# Patient Record
Sex: Female | Born: 1970
Health system: Southern US, Community
[De-identification: ages and names within clinical notes are randomized; demographics above are authoritative.]

---

## 1998-04-21 ENCOUNTER — Other Ambulatory Visit: Admission: RE | Admit: 1998-04-21 | Discharge: 1998-04-21 | Payer: Self-pay | Admitting: Obstetrics and Gynecology

## 1999-04-23 ENCOUNTER — Other Ambulatory Visit: Admission: RE | Admit: 1999-04-23 | Discharge: 1999-04-23 | Payer: Self-pay | Admitting: Obstetrics and Gynecology

## 2000-06-20 ENCOUNTER — Other Ambulatory Visit: Admission: RE | Admit: 2000-06-20 | Discharge: 2000-06-20 | Payer: Self-pay | Admitting: Obstetrics and Gynecology

## 2001-06-29 ENCOUNTER — Other Ambulatory Visit: Admission: RE | Admit: 2001-06-29 | Discharge: 2001-06-29 | Payer: Self-pay | Admitting: Obstetrics and Gynecology

## 2002-03-27 ENCOUNTER — Encounter: Admission: RE | Admit: 2002-03-27 | Discharge: 2002-06-25 | Payer: Self-pay | Admitting: Family Medicine

## 2002-07-03 ENCOUNTER — Other Ambulatory Visit: Admission: RE | Admit: 2002-07-03 | Discharge: 2002-07-03 | Payer: Self-pay | Admitting: Obstetrics and Gynecology

## 2003-07-24 ENCOUNTER — Other Ambulatory Visit: Admission: RE | Admit: 2003-07-24 | Discharge: 2003-07-24 | Payer: Self-pay | Admitting: Obstetrics and Gynecology

## 2004-04-07 ENCOUNTER — Other Ambulatory Visit: Admission: RE | Admit: 2004-04-07 | Discharge: 2004-04-07 | Payer: Self-pay | Admitting: Obstetrics and Gynecology

## 2004-11-05 ENCOUNTER — Inpatient Hospital Stay (HOSPITAL_COMMUNITY): Admission: AD | Admit: 2004-11-05 | Discharge: 2004-11-08 | Payer: Self-pay | Admitting: Obstetrics and Gynecology

## 2004-11-05 ENCOUNTER — Encounter (INDEPENDENT_AMBULATORY_CARE_PROVIDER_SITE_OTHER): Payer: Self-pay | Admitting: *Deleted

## 2004-11-10 ENCOUNTER — Ambulatory Visit: Admission: RE | Admit: 2004-11-10 | Discharge: 2004-11-10 | Payer: Self-pay | Admitting: Obstetrics and Gynecology

## 2004-12-03 ENCOUNTER — Other Ambulatory Visit: Admission: RE | Admit: 2004-12-03 | Discharge: 2004-12-03 | Payer: Self-pay | Admitting: Obstetrics and Gynecology

## 2009-05-20 ENCOUNTER — Encounter: Admission: RE | Admit: 2009-05-20 | Discharge: 2009-05-20 | Payer: Self-pay | Admitting: Obstetrics and Gynecology

## 2010-08-06 NOTE — Discharge Summary (Signed)
NAMESYANNA, REMMERT            ACCOUNT NO.:  [id number]   MEDICAL RECORD NO.:  [id number]          PATIENT TYPE:  INP   LOCATION:  9142                          FACILITY:  [facility]   PHYSICIAN:  Juluis Mire, M.D.   DATE OF BIRTH:  [birth year]   DATE OF ADMISSION:  11/05/2004  DATE OF DISCHARGE:  11/08/2004                                 DISCHARGE SUMMARY   ADMISSION DIAGNOSES:  1.  Intrauterine pregnancy at 40-3/7 weeks' estimated gestational age.  2.  Spontaneous onset of labor.   DISCHARGE DIAGNOSES:  1.  Status post low transverse cesarean section secondary to arrest of      descent and nonreassuring fetal heart tones.  2.  Viable female infant.   PROCEDURE:  Primary low transverse cesarean section.   REASON FOR ADMISSION:  Please see written H&P.   HOSPITAL COURSE:  The patient was a 40 year old white married female  primigravida who was admitted to [facility] in labor.  On  admission the cervix was noted to be 8 cm dilated, completely effaced,  vertex at a  -1 station with a bulging bag of water.  Artificial rupture of  membranes was performed, which revealed clear fluid.  The patient was known  to be positive for group B beta strep, and antibiotics were given  prophylactically.  The patient did complete full dilatation and began  pushing.  After pushing for approximately 2-1/2 hours, vertex was noted to  be molding at a +1 station.  Estimated fetal weight was 8-1/2 to 9 pounds.  A decision was made to proceed with a low transverse cesarean section.  The  patient was then transferred to the operating room, where epidural was  augmented to an adequate level.  Auscultation of fetal heart tones in the operating room was noted to be 90  beats per minute.  After testing for adequate epidural anesthesia, it was  found not to be adequate.  A decision was made to convert to the epidural to  general anesthesia, which was performed by Dr. Arby Barrette.  A low  transverse  incision was made with delivery of a viable female infant weighing 8 pounds 7  ounces  with Apgars of 8 at one minute and 9 at five minutes.  Arterial cord  pH was 7.28.  The patient tolerated the procedure well and was taken to the  recovery room in stable condition.  On postoperative day #1 vital signs were  stable, the abdomen soft.  Fundus was firm and nontender and abdominal  dressing was noted to be clean, dry and intact.  Laboratory findings  revealed hemoglobin of 10.2, platelet count of 206,000, WBC count of 11.4.  On postoperative day #2 the patient was without complaint.  Vital signs were  stable.  Fundus firm and nontender.  Abdominal dressing had been removed,  revealing an incision that was clean, dry and intact.  The patient was  ambulating well.  On postoperative day #3 the patient was without complaint.  Vital signs were stable.  Fundus was firm and nontender.  Incision was  clean, dry and intact.  Staples  were removed.  The patient was discharged  home.   CONDITION ON DISCHARGE:  Good.   DIET:  Regular as tolerated.   ACTIVITY:  No heavy lifting, no driving x2 weeks, no vaginal entry.   FOLLOW-UP:  Patient to follow up in the office in one to two weeks for  incision check.  She is to call for temperature greater than 100 degrees and  persistent nausea and vomiting, heavy vaginal bleeding and/or redness or  drainage of the incisional site.   DISCHARGE MEDICATIONS:  1.  Tylox #30, one p.o. every four to six hours p.r.n.  2.  Motrin 600 mg every six hours.  3.  Prenatal vitamins one p.o. daily.  4.  Colace n.p.o. daily.      Julio Sicks, N.P.      Juluis Mire, M.D.  Electronically Signed    CC/MEDQ  D:  12/13/2004  T:  12/14/2004  Job:  [id number]

## 2010-08-06 NOTE — Op Note (Signed)
NAMESHADE, RIVENBARK            ACCOUNT NO.:  [id number]   MEDICAL RECORD NO.:  [id number]          PATIENT TYPE:  INP   LOCATION:  9167                          FACILITY:  [facility]   PHYSICIAN:  Guy Sandifer. Henderson Cloud, M.D. DATE OF BIRTH:  1971/02/13   DATE OF PROCEDURE:  11/05/2004  DATE OF DISCHARGE:                                 OPERATIVE REPORT   PREOPERATIVE DIAGNOSES:  1.  Intrauterine pregnancy at 40-3/7 weeks.  2.  Arrest of descent.  3.  Nonreassuring fetal heart tracing.   POSTOPERATIVE DIAGNOSES:  1.  Intrauterine pregnancy at 40-3/7 weeks.  2.  Arrest of descent.  3.  Nonreassuring fetal heart tracing.   PROCEDURE:  Low transverse cesarean section.   SURGEON:  Guy Sandifer. Henderson Cloud, M.D.   ANESTHESIA:  Epidural converted to general anesthesia with endotracheal  intubation, Quillian Quince, M.D.   ESTIMATED BLOOD LOSS:  1000 mL.   SPECIMENS:  Placenta.   FINDINGS:  Viable female infant, Apgar's of 8 and 9 at 1 and 5 minutes  respectively. Birth weight 8 pounds 7 ounces, arterial cord pH 7.28.   INDICATIONS AND CONSENT:  This patient is a 40 year old married white  female, G1, P0 whose pregnancy has been complicated by a history of HSV  currently on Valtrex without prodrome or visible lesions on exam. She also  has a history of anxiety and depression and positive group B beta strep  culture. She presents to the hospital with complaints of uterine  contractions. At 8:25 a.m., the cervix is 8 cm dilated, completely effaced,  -1 station, vertex with a bulging bag of water. Artifical rupture of  membranes if performed for clear fluid. The patient is on group B beta strep  antibiotic prophylaxis. She proceeds to complete in pushing. She pushes for  approximately 2 1/2 hours and the vertex is at the plus 1 with molding. She  gets descent with pushing but the vertex retracts back to +1. Estimated  fetal weight on ultrasound was approximately 8.5 to 9 pounds. After  discussing  the options, cesarean section is reviewed. The potential risks  and complications are discussed including not limited to infection, bowel,  bladder, ureteral damage, bleeding requiring transfusion of blood products  with possible transfusion reaction, HIV and hepatitis acquisition. DVT, PE  and pneumonia. All questions are answered and consent is signed on the  chart. Fetal heart tones are reactive in the normal range in the labor and  delivery room.   DESCRIPTION OF PROCEDURE:  The patient was transferred to the operating room  where she was identified and her epidural is augmented. She is placed in the  dorsal supine position with 15 degree left lateral wedge and already has a  Foley catheter in place. Auscultation of fetal heart tones in the operating  room is in the 90's. The patient is prepped and draped in a sterile fashion.  Testing for adequate epidural anesthesia is carried out and it is found to  be inadequate. It is not felt we have time to wait for redosing of the  epidural due to the fetal heart rate. Therefore  general anesthesia is  undertaken with Dr. Arby Barrette. After establishing a secure airway, the skin  is incised through a low transverse Pfannenstiel incision and dissection is  carried out in layers to the peritoneum. The peritoneum is incised and  bluntly extended. The vesicouterine peritoneum is taken down  cephalolaterally, dissected and the bladder flap is placed. The uterus is  incised in a low transverse manner and the uterine cavity is entered bluntly  with the heel of the scalpel handle. The uterine incision is then extended  cephalolaterally with the fingers. The baby is found to be in the [facility]  presentation. Vertex is delivered without difficulty. Nuchal cord x1 is  reduced. Oral and nasopharynx are suctioned. The remainder of the baby is  delivered and good cry and tone is noted. The placenta is manually delivered  and sent to pathology. The uterine cavity is  cleaned. The uterus is closed  in two running locking imbricating layers of #0 Monocryl suture which  achieves good hemostasis. Tubes and ovaries are normal bilaterally. The  anterior peritoneum is closed in running fashion with #0 Monocryl suture  which is also used to reapproximate the pyramidalis muscle in the midline.  The anterior rectus fascia is closed in a running fashion with #0 PDS. The  skin is closed with clips. All sponge, instrument and needle counts are  correct and the patient is awakened and taken to the recovery room in stable  condition.      Guy Sandifer Henderson Cloud, M.D.  Electronically Signed     JET/MEDQ  D:  11/05/2004  T:  11/05/2004  Job:  [id number]

## 2011-03-09 ENCOUNTER — Ambulatory Visit
Admission: RE | Admit: 2011-03-09 | Discharge: 2011-03-09 | Disposition: A | Discharge status: Home / Self Care | Payer: 59 | Source: Ambulatory Visit | Attending: Family Medicine | Admitting: Family Medicine

## 2011-03-09 ENCOUNTER — Other Ambulatory Visit: Payer: Self-pay | Admitting: Family Medicine

## 2011-03-09 MED ORDER — IOHEXOL 300 MG/ML  SOLN
100.0000 mL | Freq: Once | INTRAMUSCULAR | Status: AC | PRN
  Administered 2011-03-09: 100 mL via INTRAVENOUS

## 2011-06-23 ENCOUNTER — Other Ambulatory Visit: Payer: Self-pay

## 2013-10-14 ENCOUNTER — Encounter: Payer: Self-pay | Admitting: Gastroenterology

## 2014-07-04 ENCOUNTER — Other Ambulatory Visit: Payer: Self-pay | Admitting: Family Medicine

## 2014-07-04 ENCOUNTER — Ambulatory Visit
Admission: RE | Admit: 2014-07-04 | Discharge: 2014-07-04 | Disposition: A | Discharge status: Home / Self Care | Payer: 59 | Source: Ambulatory Visit | Attending: Family Medicine | Admitting: Family Medicine

## 2014-07-04 DIAGNOSIS — M436 Torticollis: Secondary | ICD-10-CM

## 2014-07-04 DIAGNOSIS — M542 Cervicalgia: Secondary | ICD-10-CM

## 2014-08-11 ENCOUNTER — Other Ambulatory Visit: Payer: Self-pay | Admitting: Physician Assistant

## 2014-08-11 DIAGNOSIS — R1011 Right upper quadrant pain: Secondary | ICD-10-CM

## 2014-08-15 ENCOUNTER — Ambulatory Visit
Admission: RE | Admit: 2014-08-15 | Discharge: 2014-08-15 | Disposition: A | Discharge status: Home / Self Care | Payer: BLUE CROSS/BLUE SHIELD | Source: Ambulatory Visit | Attending: Physician Assistant | Admitting: Physician Assistant

## 2014-08-15 DIAGNOSIS — R1011 Right upper quadrant pain: Secondary | ICD-10-CM

## 2014-10-31 ENCOUNTER — Other Ambulatory Visit: Payer: Self-pay | Admitting: Obstetrics and Gynecology

## 2014-11-03 LAB — CYTOLOGY - PAP

## 2015-11-05 ENCOUNTER — Other Ambulatory Visit: Payer: Self-pay | Admitting: Obstetrics and Gynecology

## 2015-11-05 DIAGNOSIS — R928 Other abnormal and inconclusive findings on diagnostic imaging of breast: Secondary | ICD-10-CM

## 2015-11-10 ENCOUNTER — Ambulatory Visit
Admission: RE | Admit: 2015-11-10 | Discharge: 2015-11-10 | Disposition: A | Discharge status: Home / Self Care | Payer: Managed Care, Other (non HMO) | Source: Ambulatory Visit | Attending: Obstetrics and Gynecology | Admitting: Obstetrics and Gynecology

## 2015-11-10 DIAGNOSIS — R928 Other abnormal and inconclusive findings on diagnostic imaging of breast: Secondary | ICD-10-CM

## 2017-01-27 DIAGNOSIS — J069 Acute upper respiratory infection, unspecified: Secondary | ICD-10-CM | POA: Diagnosis not present

## 2017-01-27 DIAGNOSIS — B9789 Other viral agents as the cause of diseases classified elsewhere: Secondary | ICD-10-CM | POA: Diagnosis not present

## 2017-02-15 IMAGING — CR DG CERVICAL SPINE 2 OR 3 VIEWS
3 series · 3 of 3 positions shown · non-contrast
Comparison: None.

CLINICAL DATA: Chronic cervicalgia and stiffness. Pain radiates
into both shoulders

EXAM:
CERVICAL SPINE - 2-3 VIEW

[w cervical spine lat]
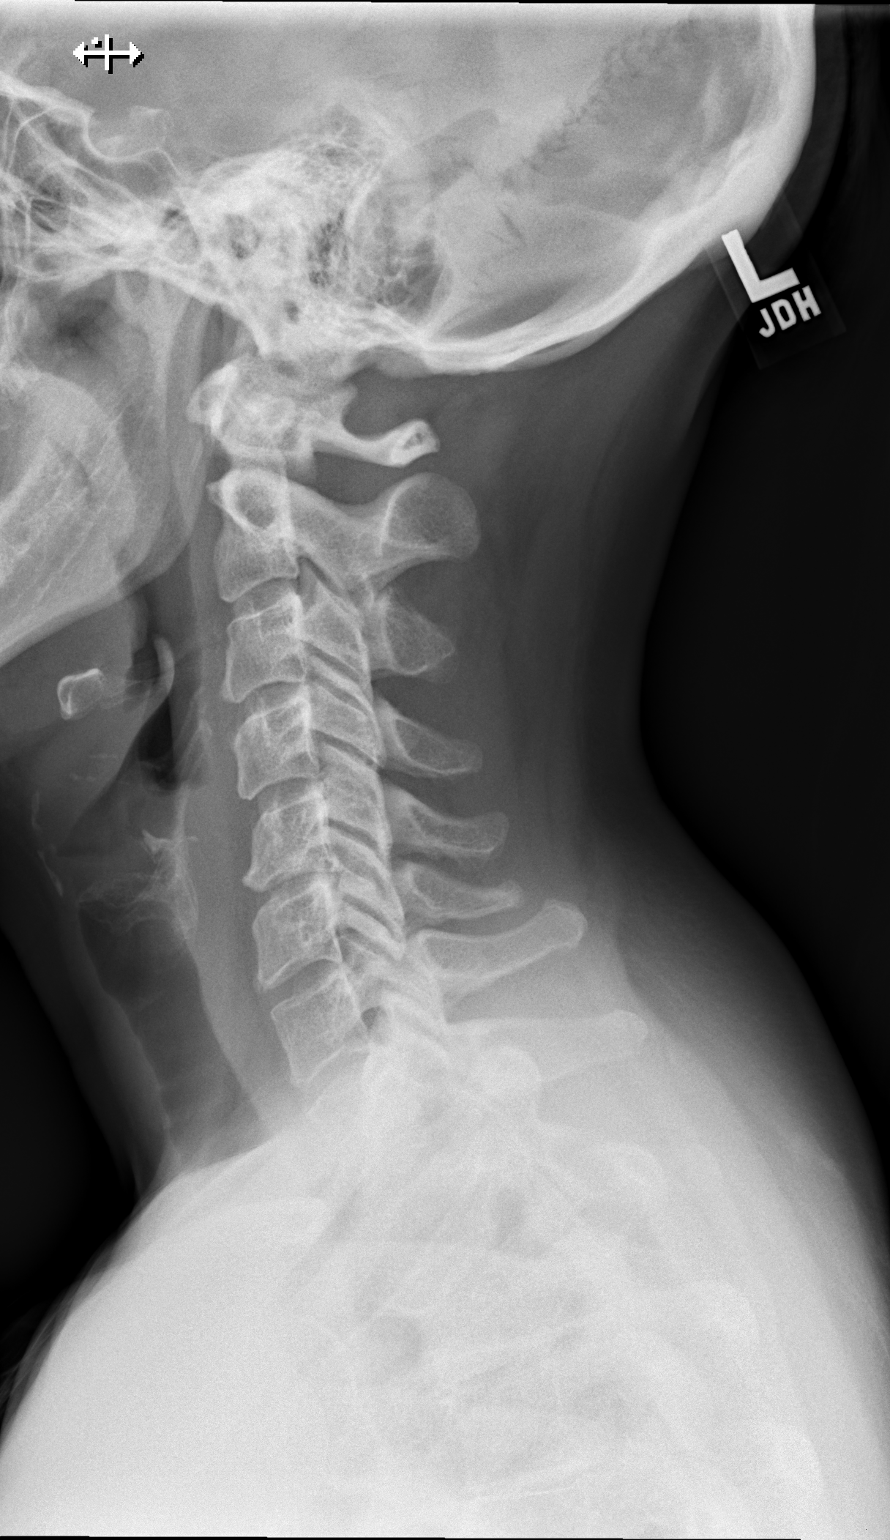

[w cervical spine ap]
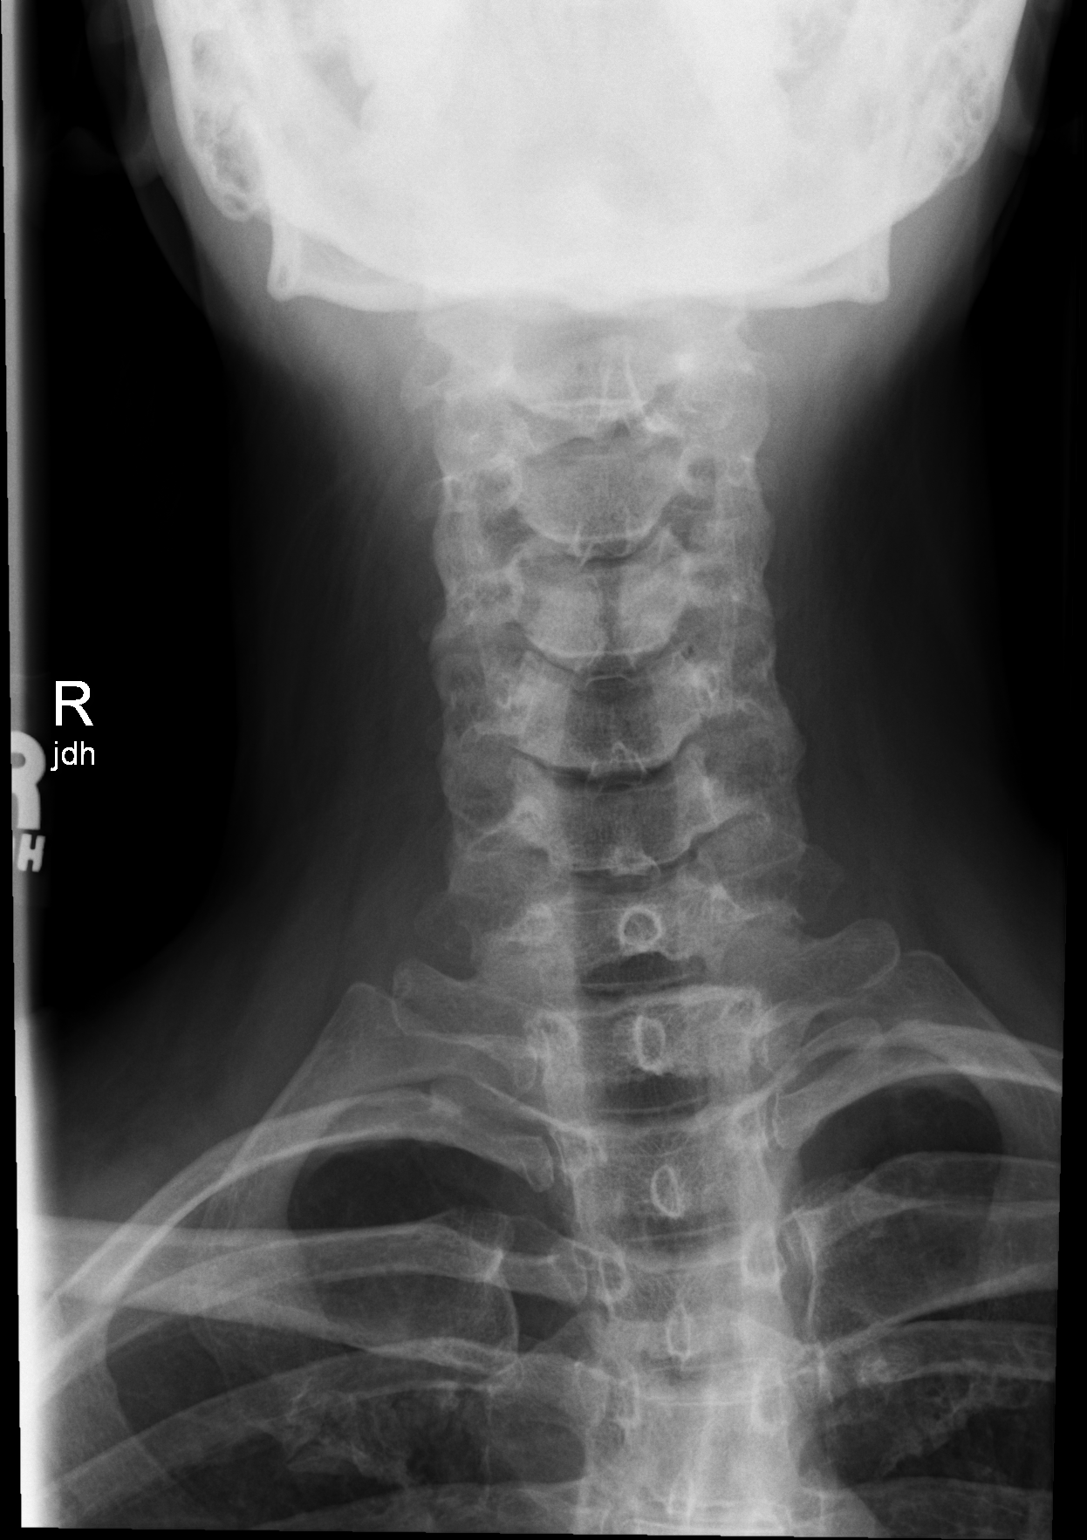

[w cervical spine odontoid]
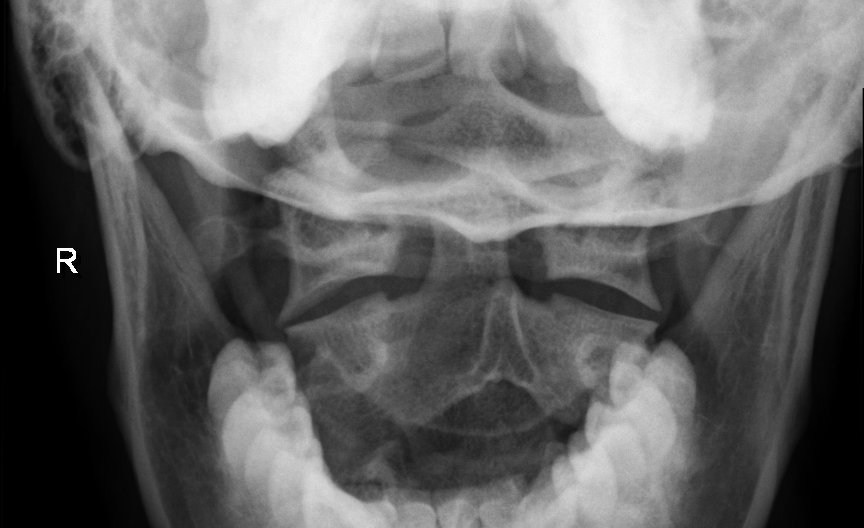

[3 of 3 positions shown; findings below may reference images not displayed]

FINDINGS: Frontal, lateral, and open-mouth odontoid images were obtained.
There is no fracture or spondylolisthesis. Prevertebral soft tissues
and predental space regions are normal. There is moderate disc space
narrowing at C5-6. Other disc spaces appear normal. There is no
erosive change. There is upper thoracic levoscoliosis.
IMPRESSION: Focal disc space narrowing at C5-6. No fracture or
spondylolisthesis. Upper thoracic levoscoliosis.

## 2017-03-07 DIAGNOSIS — F329 Major depressive disorder, single episode, unspecified: Secondary | ICD-10-CM | POA: Diagnosis not present

## 2017-03-07 DIAGNOSIS — F411 Generalized anxiety disorder: Secondary | ICD-10-CM | POA: Diagnosis not present

## 2017-09-10 DIAGNOSIS — F411 Generalized anxiety disorder: Secondary | ICD-10-CM | POA: Diagnosis not present

## 2017-09-12 DIAGNOSIS — K219 Gastro-esophageal reflux disease without esophagitis: Secondary | ICD-10-CM | POA: Diagnosis not present

## 2017-09-12 DIAGNOSIS — F411 Generalized anxiety disorder: Secondary | ICD-10-CM | POA: Diagnosis not present

## 2017-09-12 DIAGNOSIS — F329 Major depressive disorder, single episode, unspecified: Secondary | ICD-10-CM | POA: Diagnosis not present

## 2017-11-01 DIAGNOSIS — F411 Generalized anxiety disorder: Secondary | ICD-10-CM | POA: Diagnosis not present

## 2017-12-07 DIAGNOSIS — Z6831 Body mass index (BMI) 31.0-31.9, adult: Secondary | ICD-10-CM | POA: Diagnosis not present

## 2017-12-07 DIAGNOSIS — Z01419 Encounter for gynecological examination (general) (routine) without abnormal findings: Secondary | ICD-10-CM | POA: Diagnosis not present

## 2017-12-07 DIAGNOSIS — Z1231 Encounter for screening mammogram for malignant neoplasm of breast: Secondary | ICD-10-CM | POA: Diagnosis not present

## 2017-12-07 DIAGNOSIS — Z803 Family history of malignant neoplasm of breast: Secondary | ICD-10-CM | POA: Diagnosis not present

## 2017-12-11 ENCOUNTER — Other Ambulatory Visit: Payer: Self-pay | Admitting: Obstetrics and Gynecology

## 2017-12-11 DIAGNOSIS — R928 Other abnormal and inconclusive findings on diagnostic imaging of breast: Secondary | ICD-10-CM

## 2017-12-14 ENCOUNTER — Ambulatory Visit: Admission: RE | Admit: 2017-12-14 | Payer: Managed Care, Other (non HMO) | Source: Ambulatory Visit

## 2017-12-14 ENCOUNTER — Ambulatory Visit
Admission: RE | Admit: 2017-12-14 | Discharge: 2017-12-14 | Disposition: A | Discharge status: Home / Self Care | Payer: BLUE CROSS/BLUE SHIELD | Source: Ambulatory Visit | Attending: Obstetrics and Gynecology | Admitting: Obstetrics and Gynecology

## 2017-12-14 DIAGNOSIS — R922 Inconclusive mammogram: Secondary | ICD-10-CM | POA: Diagnosis not present

## 2017-12-14 DIAGNOSIS — R928 Other abnormal and inconclusive findings on diagnostic imaging of breast: Secondary | ICD-10-CM

## 2018-01-17 DIAGNOSIS — R87615 Unsatisfactory cytologic smear of cervix: Secondary | ICD-10-CM | POA: Diagnosis not present

## 2018-01-17 DIAGNOSIS — Z01419 Encounter for gynecological examination (general) (routine) without abnormal findings: Secondary | ICD-10-CM | POA: Diagnosis not present

## 2018-01-17 DIAGNOSIS — R799 Abnormal finding of blood chemistry, unspecified: Secondary | ICD-10-CM | POA: Diagnosis not present

## 2018-01-19 ENCOUNTER — Other Ambulatory Visit: Payer: Self-pay | Admitting: Obstetrics and Gynecology

## 2018-01-19 DIAGNOSIS — Z803 Family history of malignant neoplasm of breast: Secondary | ICD-10-CM

## 2018-02-07 DIAGNOSIS — F411 Generalized anxiety disorder: Secondary | ICD-10-CM | POA: Diagnosis not present

## 2018-05-03 ENCOUNTER — Encounter: Payer: Self-pay | Admitting: Podiatry

## 2018-05-03 ENCOUNTER — Ambulatory Visit (INDEPENDENT_AMBULATORY_CARE_PROVIDER_SITE_OTHER): Payer: BLUE CROSS/BLUE SHIELD

## 2018-05-03 ENCOUNTER — Ambulatory Visit (INDEPENDENT_AMBULATORY_CARE_PROVIDER_SITE_OTHER): Payer: BLUE CROSS/BLUE SHIELD | Admitting: Podiatry

## 2018-05-03 VITALS — BP 111/60

## 2018-05-03 DIAGNOSIS — M722 Plantar fascial fibromatosis: Secondary | ICD-10-CM | POA: Diagnosis not present

## 2018-05-03 MED ORDER — TRIAMCINOLONE ACETONIDE 10 MG/ML IJ SUSP
10.0000 mg | Freq: Once | INTRAMUSCULAR | Status: AC
  Administered 2018-05-03: 10 mg

## 2018-05-03 MED ORDER — DICLOFENAC SODIUM 75 MG PO TBEC: 75.0000 mg | DELAYED_RELEASE_TABLET | Freq: Two times a day (BID) | ORAL | 2 refills | Status: AC

## 2018-05-03 NOTE — Patient Instructions (Signed)

## 2018-05-03 NOTE — Progress Notes (Signed)
Subjective:   Patient ID: Cindy Mcintyre, female   DOB: 49 y.o.   MRN: [id number]   HPI Patient presents with exquisite discomfort plantar aspect of the right heel and states she started to get other symptoms in both the right and left foot secondary to change in gait and this is been going on for at least 3 months.  Worse in the morning and after standing and walking.  Patient does not smoke and likes to be active   Review of Systems  All other systems reviewed and are negative.       Objective:  Physical Exam Vitals signs and nursing note reviewed.  Constitutional:      Appearance: She is well-developed.  Pulmonary:     Effort: Pulmonary effort is normal.  Musculoskeletal: Normal range of motion.  Skin:    General: Skin is warm.  Neurological:     Mental Status: She is alert.     Neurovascular status found to be intact muscle strength was adequate range of motion within normal limits with patient noted to have exquisite discomfort plantar aspect right heel at the insertional point of the tendon into the calcaneus and is noted to have moderate compensatory symptoms in the lateral side of the right foot the posterior heel region and in the left foot.  Patient is found to have good digital perfusion well oriented x3     Assessment:  Acute plantar fasciitis right heel at the insertion with moderate compensatory symptoms     Plan:  H&P conditions reviewed and sterile prep applied and injected the right plantar fascia 3 mg Kenalog 5 mg Xylocaine applied fascial brace with instructions placed on diclofenac 75 mg twice daily and reappoint again in 2 weeks to reevaluate  X-rays indicate that there is small spur with no indication to stress fracture arthritis

## 2018-05-17 ENCOUNTER — Encounter: Payer: Self-pay | Admitting: Podiatry

## 2018-05-17 ENCOUNTER — Ambulatory Visit (INDEPENDENT_AMBULATORY_CARE_PROVIDER_SITE_OTHER): Payer: BLUE CROSS/BLUE SHIELD | Admitting: Podiatry

## 2018-05-17 DIAGNOSIS — M722 Plantar fascial fibromatosis: Secondary | ICD-10-CM

## 2018-05-17 MED ORDER — TRIAMCINOLONE ACETONIDE 10 MG/ML IJ SUSP
10.0000 mg | Freq: Once | INTRAMUSCULAR | Status: AC
  Administered 2018-05-17: 10 mg

## 2018-05-17 NOTE — Progress Notes (Signed)
Subjective:   Patient ID: Cindy Mcintyre, female   DOB: 48 y.o.   MRN: [id number]   HPI Patient presents with right heel pain stating that it is some better than it was but still sore   ROS      Objective:  Physical Exam  Neurovascular status intact with exquisite discomfort plantar aspect right heel at the insertional point tendon calcaneus in one spot noted     Assessment:  Plantar fasciitis improved right with one area of acute discomfort     Plan:  Sterile prep and reinjected this 1 area 3 mg Kenalog 5 mg Xylocaine which was tolerated well and reappoint for [place] to recheck again in the next several weeks as needed

## 2018-07-14 ENCOUNTER — Encounter (HOSPITAL_BASED_OUTPATIENT_CLINIC_OR_DEPARTMENT_OTHER): Payer: Self-pay | Admitting: Emergency Medicine

## 2018-07-14 ENCOUNTER — Emergency Department (HOSPITAL_BASED_OUTPATIENT_CLINIC_OR_DEPARTMENT_OTHER): Payer: BLUE CROSS/BLUE SHIELD

## 2018-07-14 ENCOUNTER — Other Ambulatory Visit: Payer: Self-pay

## 2018-07-14 ENCOUNTER — Emergency Department (HOSPITAL_BASED_OUTPATIENT_CLINIC_OR_DEPARTMENT_OTHER)
Admission: EM | Admit: 2018-07-14 | Discharge: 2018-07-14 | Disposition: A | Discharge status: Home / Self Care | Payer: BLUE CROSS/BLUE SHIELD | Attending: Emergency Medicine | Admitting: Emergency Medicine

## 2018-07-14 DIAGNOSIS — Z79899 Other long term (current) drug therapy: Secondary | ICD-10-CM | POA: Diagnosis not present

## 2018-07-14 DIAGNOSIS — R1010 Upper abdominal pain, unspecified: Secondary | ICD-10-CM

## 2018-07-14 DIAGNOSIS — R109 Unspecified abdominal pain: Secondary | ICD-10-CM | POA: Diagnosis not present

## 2018-07-14 DIAGNOSIS — K8020 Calculus of gallbladder without cholecystitis without obstruction: Secondary | ICD-10-CM | POA: Diagnosis not present

## 2018-07-14 DIAGNOSIS — R1011 Right upper quadrant pain: Secondary | ICD-10-CM | POA: Diagnosis not present

## 2018-07-14 LAB — COMPREHENSIVE METABOLIC PANEL
ALT: 23 U/L (ref 0–44)
AST: 28 U/L (ref 15–41)
Albumin: 3.7 g/dL (ref 3.5–5.0)
Alkaline Phosphatase: 76 U/L (ref 38–126)
Anion gap: 9 (ref 5–15)
BUN: 13 mg/dL (ref 6–20)
CO2: 21 mmol/L — ABNORMAL LOW (ref 22–32)
Calcium: 9.3 mg/dL (ref 8.9–10.3)
Chloride: 107 mmol/L (ref 98–111)
Creatinine, Ser: 0.69 mg/dL (ref 0.44–1.00)
GFR calc Af Amer: >60 mL/min (ref >60)
GFR calc non Af Amer: >60 mL/min (ref >60)
Glucose, Bld: 100 mg/dL — ABNORMAL HIGH (ref 70–99)
Potassium: 3.9 mmol/L (ref 3.5–5.1)
Sodium: 137 mmol/L (ref 135–145)
Total Bilirubin: 0.3 mg/dL (ref 0.3–1.2)
Total Protein: 7.3 g/dL (ref 6.5–8.1)

## 2018-07-14 LAB — LIPASE, BLOOD: Lipase: 26 U/L (ref 11–51)

## 2018-07-14 NOTE — Discharge Instructions (Signed)
If you start having worsening pain that will not go away or vomiting, fever or shortness of breath you should return to the ER.  Avoid fatty, fried or raw veggies.

## 2018-07-14 NOTE — ED Notes (Signed)
Patient transported to [facility] 

## 2018-07-14 NOTE — ED Triage Notes (Signed)
Pt c/o thoracic back pain radiating into her abd with nausea since 7am. Sent from [facility].

## 2018-07-14 NOTE — ED Provider Notes (Addendum)
[facility] EMERGENCY DEPARTMENT Provider Note   CSN: [id number] Arrival date & time: 07/14/18  1121    History   Chief Complaint Chief Complaint  Patient presents with  . Back Pain    HPI Cindy Mcintyre is a 48 y.o. female.     Patient is a 48 year old female with a history of depression who is presenting today with acute onset of back and abdominal pain.  Patient went to the [facility] walk-in clinic and had blood work done but was sent here for further evaluation.  Patient states that about 2 weeks ago she had gone bike riding on a Friday and the next day in the middle of the night woke up with an intense pain in her bilateral thoracic back that radiated down and just felt very sore but could not be palpated.  It went away after 5 or 6 hours and then had not returned until this morning around 730 she started having that same feeling of pain in her back but this time it was 10 times worse and radiated around into her upper abdomen causing her to vomit due to the pain being so severe.  The most severe pain lasted approximately 20 minutes and now she does have some minimal soreness in her epigastric region.  She states she does have a history of GERD and a few years ago had an ultrasound that showed that she did have gallstones but at the time they did not feel that it was causing any trouble and she is never had surgery.  Last night before bed she had a hamburger and [place] fries and had chocolate chip cookies around 9:00.  She denies any diarrhea, chest pain, shortness of breath, cough, leg pain or neurologic complaints.  She has had no fever.  The history is provided by the patient.  Back Pain  Location:  Thoracic spine Quality:  Shooting Radiates to: radiates to the abd. Pain severity:  Severe (10/10 but now much improved and just 2/10) Onset quality:  Sudden Duration: of severe pain and sore now for the last 3 hours. Timing:  Constant Progression:  Resolved  Chronicity:  Recurrent Relieved by:  None tried Worsened by:  Nothing Associated symptoms: abdominal pain   Associated symptoms: no chest pain, no dysuria, no fever, no leg pain and no numbness     History reviewed. No pertinent past medical history.  There are no active problems to display for this patient.   History reviewed. No pertinent surgical history.   OB History   No obstetric history on file.      Home Medications    Prior to Admission medications   Medication Sig Start Date End Date Taking? Authorizing Provider  buPROPion (WELLBUTRIN XL) 150 MG 24 hr tablet TAKE 1 TABLET BY MOUTH ONCE DAILY IN THE MORNING FOR 90 DAYS 12/07/17   [provider]  citalopram (CELEXA) 40 MG tablet TAKE 1 TABLET BY MOUTH ONCE DAILY FOR 90 DAYS 12/07/17   [provider]  diclofenac (VOLTAREN) 75 MG EC tablet Take 1 tablet (75 mg total) by mouth 2 (two) times daily. 05/03/18   Lenn Sink, DPM  NUVARING 0.12-0.015 MG/24HR vaginal ring I 1 RING VAGINALLY FOR 3 WKS THEN 1 WK OFF 04/19/18   [provider]    Family History Family History  Problem Relation Age of Onset  . Breast cancer Maternal Aunt   . Breast cancer Maternal Grandmother   . Breast cancer Maternal Aunt  Social History Social History   Tobacco Use  . Smoking status: Never Smoker  . Smokeless tobacco: Never Used  Substance Use Topics  . Alcohol use: Not Currently  . Drug use: Not on file     Allergies   Patient has no known allergies.   Review of Systems Review of Systems  Constitutional: Negative for fever.  Cardiovascular: Negative for chest pain.  Gastrointestinal: Positive for abdominal pain.  Genitourinary: Negative for dysuria.  Musculoskeletal: Positive for back pain.  Neurological: Negative for numbness.  All other systems reviewed and are negative.    Physical Exam Updated Vital Signs BP (!) 143/80 (BP Location: Right Arm)   Pulse 76   Temp 98.1 F (36.7 C)  (Oral)   Resp 18   Ht 5\' 7"  (1.702 m)   Wt 88.5 kg   SpO2 99%   BMI 30.54 kg/m   Physical Exam Vitals signs and nursing note reviewed.  Constitutional:      General: She is not in acute distress.    Appearance: She is well-developed.  HENT:     Head: Normocephalic and atraumatic.  Eyes:     Pupils: Pupils are equal, round, and reactive to light.  Cardiovascular:     Rate and Rhythm: Normal rate and regular rhythm.     Heart sounds: Normal heart sounds. No murmur. No friction rub.  Pulmonary:     Effort: Pulmonary effort is normal.     Breath sounds: Normal breath sounds. No wheezing or rales.  Abdominal:     General: Bowel sounds are normal. There is no distension.     Palpations: Abdomen is soft.     Tenderness: There is abdominal tenderness in the epigastric area. There is no guarding or rebound. Negative signs include Murphy's sign.     Comments: Mild epigastric tenderness  Musculoskeletal: Normal range of motion.        General: No tenderness.     Comments: No edema  Skin:    General: Skin is warm and dry.     Findings: No rash.  Neurological:     General: No focal deficit present.     Mental Status: She is alert and oriented to person, place, and time. Mental status is at baseline.     Cranial Nerves: No cranial nerve deficit.  Psychiatric:        Behavior: Behavior normal.      ED Treatments / Results  Labs (all labs ordered are listed, but only abnormal results are displayed) Labs Reviewed  COMPREHENSIVE METABOLIC PANEL - Abnormal; Notable for the following components:      Result Value   CO2 21 (*)    Glucose, Bld 100 (*)    All other components within normal limits  LIPASE, BLOOD    EKG None  Radiology [place] Abdomen Limited Ruq  Result Date: 07/14/2018 CLINICAL DATA:  Back pain radiating to the abdomen.  Nausea. EXAM: ULTRASOUND ABDOMEN LIMITED RIGHT UPPER QUADRANT COMPARISON:  08/15/2014 FINDINGS: Gallbladder: The gallbladder is filled with sludge.  Non dependent gallstones are noted. The largest measures 5 mm. No Murphy sign or wall thickening. No pericholecystic fluid. Common bile duct: Diameter: 3 mm. Liver: No focal lesion identified. Within normal limits in parenchymal echogenicity. Portal vein is patent on color Doppler imaging with normal direction of blood flow towards the liver. IMPRESSION: Cholelithiasis Normal gallbladder and biliary tree. Electronically Signed   By: Jolaine ClickArthur  Hoss M.D.   On: 07/14/2018 12:25    Procedures Procedures (including  critical care time)  Medications Ordered in ED Medications - No data to display   Initial Impression / Assessment and Plan / ED Course  I have reviewed the triage vital signs and the nursing notes.  Pertinent labs & imaging results that were available during my care of the patient were reviewed by me and considered in my medical decision making (see chart for details).        48 year old female presenting with acute onset of severe back and upper abdominal pain that started this morning.  May have had a lesser attack approximately 2 weeks ago.  She was seen at the [facility] walk-in clinic and had a normal CBC and UA.  She denies any urinary symptoms.  No diarrhea but did have an episode of vomiting when the pain was intense.  Patient does have a significant history of GERD and takes famotidine daily.  Patient attack started while she was lying down and she did have some similar back pain a few weeks ago in the middle of the night.  Patient has a history of gallstones but was not deemed necessary for cholecystectomy but has a strong family history of all females in her family have had their gallbladders removed.  Patient was sent here for further evaluation.  We will do a CMP, lipase and right upper quadrant ultrasound.  Low suspicion at this time for dissection, small bowel obstruction, hepatitis, kidney stone.  This could also be esophageal spasm related to her GERD.  1:21 PM CMP with normal  LFT's and lipase wnl.  US showing cholelithiasis with gallbladder filled with sludge and multiple gallstones but no wall thickening or pericholecystic fluid.  Duct size is normal.  Patient continues to be pain-free here.  Will discharge home with follow-up with [facility] [place].  Also because patient still seems to get heartburn she is going to switch to Prilosec from Pepcid and follow-up with her doctor if that does not help.  Final Clinical Impressions(s) / ED Diagnoses   Final diagnoses:  Upper abdominal pain  Calculus of gallbladder without cholecystitis without obstruction    ED Discharge Orders    None       Gwyneth Sprout, MD 07/14/18 1150    Gwyneth Sprout, MD 07/14/18 1323

## 2018-07-14 NOTE — ED Notes (Signed)
Pt denies otc medications today, denies fever

## 2018-07-14 NOTE — ED Notes (Signed)
ED Provider at bedside. 

## 2018-07-26 ENCOUNTER — Ambulatory Visit: Payer: Self-pay | Admitting: General Surgery

## 2018-07-26 DIAGNOSIS — K8020 Calculus of gallbladder without cholecystitis without obstruction: Secondary | ICD-10-CM | POA: Diagnosis not present

## 2018-07-26 NOTE — H&P (Signed)
History of Present Illness Cindy Filler MD; 07/26/2018 9:41 AM) The patient is a 48 year old female who presents for evaluation of gall stones. Referred by: Cindy Hilding, MD Chief Complaint: Abdominal pain  Patient is a 48 year old female with a known history of gallstones. Patient states that approximately 2 weeks ago she had some epigastric/abdominal pain that radiates to the back. She states this followed a high fatty meal. She states this is associated with nausea and vomiting. She does state that this helped her discomfort and pain. He states that since that time she reported high fatty meals which is minimize the recurrence of her symptoms. Patient did have an ultrasound revealed multiple gallstones. I did review this personally. Patient also had LFTs for which an normal limits.    Past Surgical History Cindy Mcintyre, [place]; 07/26/2018 9:30 AM) Cesarean Section - 1   Diagnostic Studies History Cindy Mcintyre, [place]; 07/26/2018 9:30 AM) Colonoscopy  >10 years ago Mammogram  within last year Pap Smear  1-5 years ago  Allergies Cindy Mcintyre, CMA; 07/26/2018 9:30 AM) No Known Drug Allergies [09/04/2014]: Allergies Reconciled   Medication History Cindy Mcintyre, CMA; 07/26/2018 9:31 AM) Wellbutrin SR (150MG  Tablet ER 12HR, Oral) Active. PriLOSEC (10MG  Packet, Oral) Active. NuvaRing (0.12-0.015MG /24HR Ring, Vaginal) Active. Citalopram Hydrobromide (40MG  Tablet, Oral) Active. Medications Reconciled  Social History Cindy Mcintyre, [place]; 07/26/2018 9:30 AM) Caffeine use  Carbonated beverages, Coffee, Tea. No drug use  Tobacco use  Never smoker.  Family History Cindy Mcintyre, [place]; 07/26/2018 9:30 AM) Breast Cancer  Family Members In General. Cerebrovascular Accident  Mother. Seizure disorder  Mother.  Pregnancy / Birth History Cindy Mcintyre, [place]; 07/26/2018 9:30 AM) Age at menarche  15 years, 16 years. Contraceptive History  Contraceptive implant,  Oral contraceptives. Gravida  1 Irregular periods  Length (months) of breastfeeding  3-6 Maternal age  70-35 Para  1  Other Problems Cindy Mcintyre, CMA; 07/26/2018 9:30 AM) Anxiety Disorder  Back Pain  Cholelithiasis  Depression  Gastroesophageal Reflux Disease     Review of Systems Cindy Filler MD; 07/26/2018 9:40 AM) General Not Present- Appetite Loss, Chills, Fatigue, Fever, Night Sweats, Weight Gain and Weight Loss. Skin Not Present- Change in Wart/Mole, Dryness, Hives, Jaundice, New Lesions, Non-Healing Wounds, Rash and Ulcer. HEENT Present- Wears glasses/contact lenses. Not Present- Earache, Hearing Loss, Hoarseness, Nose Bleed, Oral Ulcers, Ringing in the Ears, Seasonal Allergies, Sinus Pain, Sore Throat, Visual Disturbances and Yellow Eyes. Respiratory Not Present- Bloody sputum, Chronic Cough, Difficulty Breathing, Snoring and Wheezing. Breast Not Present- Breast Mass, Breast Pain, Nipple Discharge and Skin Changes. Cardiovascular Not Present- Chest Pain, Difficulty Breathing Lying Down, Leg Cramps, Palpitations, Rapid Heart Rate, Shortness of Breath and Swelling of Extremities. Gastrointestinal Not Present- Abdominal Pain, Bloating, Bloody Stool, Change in Bowel Habits, Chronic diarrhea, Constipation, Difficulty Swallowing, Excessive gas, Gets full quickly at meals, Hemorrhoids, Indigestion, Nausea, Rectal Pain and Vomiting. Female Genitourinary Not Present- Frequency, Nocturia, Painful Urination, Pelvic Pain and Urgency. Musculoskeletal Present- Back Pain and Joint Stiffness. Not Present- Joint Pain, Muscle Pain, Muscle Weakness and Swelling of Extremities. Neurological Not Present- Decreased Memory, Fainting, Headaches, Numbness, Seizures, Tingling, Tremor, Trouble walking and Weakness. Psychiatric Not Present- Anxiety, Bipolar, Change in Sleep Pattern, Depression, Fearful and Frequent crying. Endocrine Not Present- Cold Intolerance, Excessive Hunger, Hair  Changes, Heat Intolerance, Hot flashes and New Diabetes. Hematology Not Present- Blood Thinners, Easy Bruising, Excessive bleeding, Gland problems, HIV and Persistent Infections. All other systems negative  Vitals Cindy Mcintyre CMA; 07/26/2018 9:30 AM) 07/26/2018 9:30 AM  Weight: 197.6 lb Height: 67.5in Body Surface Area: 2.02 m Body Mass Index: 30.49 kg/m  Temp.: 97.47F  Pulse: 111 (Regular)  BP: 138/80 (Sitting, Left Arm, Standard)       Physical Exam Cindy Mcintyre(Cindy Pember MD; 07/26/2018 9:41 AM) The physical exam findings are as follows: Note:Constitutional: No acute distress, conversant, appears stated age  Eyes: Anicteric sclerae, moist conjunctiva, no lid lag  Neck: No thyromegaly, trachea midline, no cervical lymphadenopathy  Lungs: Clear to auscultation biilaterally, normal respiratory effot  Cardiovascular: regular rate & rhythm, no murmurs, no peripheal edema, pedal pulses 2+  GI: Soft, no masses or hepatosplenomegaly, non-tender to palpation  MSK: Normal gait, no clubbing cyanosis, edema  Skin: No rashes, palpation reveals normal skin turgor  Psychiatric: Appropriate judgment and insight, oriented to person, place, and time    Assessment & Plan Cindy Mcintyre(Cindy Santistevan MD; 07/26/2018 9:42 AM) GALLSTONES (K80.20) Impression: 48 year old female with symptomatic cholelithiasis Patient would like to proceed to the operating room for laparoscopic cholecystectomy based on her symptoms. I discussed with her to minimize her fat intake to minimize symptoms.  1. We will proceed to the operating room for a laparoscopic cholecystectomy  2. Risks and benefits were discussed with the patient to generally include, but not limited to: infection, bleeding, possible need for post op ERCP, damage to the bile ducts, bile leak, and possible need for further surgery. Alternatives were offered and described. All questions were answered and the patient voiced understanding of the  procedure and wishes to proceed at this point with a laparoscopic cholecystectomy

## 2018-08-01 DIAGNOSIS — F411 Generalized anxiety disorder: Secondary | ICD-10-CM | POA: Diagnosis not present

## 2018-08-02 DIAGNOSIS — K8020 Calculus of gallbladder without cholecystitis without obstruction: Secondary | ICD-10-CM | POA: Diagnosis not present

## 2018-08-02 DIAGNOSIS — R112 Nausea with vomiting, unspecified: Secondary | ICD-10-CM | POA: Diagnosis not present

## 2018-08-02 DIAGNOSIS — R1084 Generalized abdominal pain: Secondary | ICD-10-CM | POA: Diagnosis not present

## 2018-08-02 DIAGNOSIS — K219 Gastro-esophageal reflux disease without esophagitis: Secondary | ICD-10-CM | POA: Diagnosis not present

## 2018-08-02 DIAGNOSIS — Z79899 Other long term (current) drug therapy: Secondary | ICD-10-CM | POA: Diagnosis not present

## 2018-08-02 DIAGNOSIS — K811 Chronic cholecystitis: Secondary | ICD-10-CM | POA: Diagnosis not present

## 2018-08-02 DIAGNOSIS — E669 Obesity, unspecified: Secondary | ICD-10-CM | POA: Diagnosis not present

## 2018-08-02 DIAGNOSIS — Z6831 Body mass index (BMI) 31.0-31.9, adult: Secondary | ICD-10-CM | POA: Diagnosis not present

## 2018-08-02 DIAGNOSIS — Z20828 Contact with and (suspected) exposure to other viral communicable diseases: Secondary | ICD-10-CM | POA: Diagnosis not present

## 2018-08-02 DIAGNOSIS — F329 Major depressive disorder, single episode, unspecified: Secondary | ICD-10-CM | POA: Diagnosis not present

## 2018-08-02 DIAGNOSIS — Z793 Long term (current) use of hormonal contraceptives: Secondary | ICD-10-CM | POA: Diagnosis not present

## 2018-08-02 DIAGNOSIS — K8010 Calculus of gallbladder with chronic cholecystitis without obstruction: Secondary | ICD-10-CM | POA: Diagnosis not present

## 2018-12-10 DIAGNOSIS — H9190 Unspecified hearing loss, unspecified ear: Secondary | ICD-10-CM | POA: Diagnosis not present

## 2018-12-10 DIAGNOSIS — H6123 Impacted cerumen, bilateral: Secondary | ICD-10-CM | POA: Diagnosis not present

## 2018-12-10 DIAGNOSIS — H938X2 Other specified disorders of left ear: Secondary | ICD-10-CM | POA: Diagnosis not present

## 2018-12-20 DIAGNOSIS — Z131 Encounter for screening for diabetes mellitus: Secondary | ICD-10-CM | POA: Diagnosis not present

## 2018-12-20 DIAGNOSIS — Z3040 Encounter for surveillance of contraceptives, unspecified: Secondary | ICD-10-CM | POA: Diagnosis not present

## 2018-12-20 DIAGNOSIS — Z13228 Encounter for screening for other metabolic disorders: Secondary | ICD-10-CM | POA: Diagnosis not present

## 2018-12-20 DIAGNOSIS — Z6830 Body mass index (BMI) 30.0-30.9, adult: Secondary | ICD-10-CM | POA: Diagnosis not present

## 2018-12-20 DIAGNOSIS — Z01419 Encounter for gynecological examination (general) (routine) without abnormal findings: Secondary | ICD-10-CM | POA: Diagnosis not present

## 2018-12-20 DIAGNOSIS — Z13220 Encounter for screening for lipoid disorders: Secondary | ICD-10-CM | POA: Diagnosis not present

## 2018-12-20 DIAGNOSIS — Z1231 Encounter for screening mammogram for malignant neoplasm of breast: Secondary | ICD-10-CM | POA: Diagnosis not present

## 2018-12-20 DIAGNOSIS — Z1329 Encounter for screening for other suspected endocrine disorder: Secondary | ICD-10-CM | POA: Diagnosis not present

## 2019-01-09 DIAGNOSIS — Z85828 Personal history of other malignant neoplasm of skin: Secondary | ICD-10-CM | POA: Diagnosis not present

## 2019-01-09 DIAGNOSIS — D225 Melanocytic nevi of trunk: Secondary | ICD-10-CM | POA: Diagnosis not present

## 2019-01-09 DIAGNOSIS — L820 Inflamed seborrheic keratosis: Secondary | ICD-10-CM | POA: Diagnosis not present

## 2019-01-09 DIAGNOSIS — L570 Actinic keratosis: Secondary | ICD-10-CM | POA: Diagnosis not present

## 2019-01-09 DIAGNOSIS — L814 Other melanin hyperpigmentation: Secondary | ICD-10-CM | POA: Diagnosis not present

## 2019-01-23 DIAGNOSIS — F411 Generalized anxiety disorder: Secondary | ICD-10-CM | POA: Diagnosis not present

## 2019-01-23 DIAGNOSIS — F329 Major depressive disorder, single episode, unspecified: Secondary | ICD-10-CM | POA: Diagnosis not present

## 2019-01-25 DIAGNOSIS — L570 Actinic keratosis: Secondary | ICD-10-CM | POA: Diagnosis not present

## 2019-02-19 DIAGNOSIS — R799 Abnormal finding of blood chemistry, unspecified: Secondary | ICD-10-CM | POA: Diagnosis not present

## 2019-03-05 DIAGNOSIS — R7303 Prediabetes: Secondary | ICD-10-CM | POA: Diagnosis not present

## 2019-03-28 DIAGNOSIS — L570 Actinic keratosis: Secondary | ICD-10-CM | POA: Diagnosis not present

## 2019-03-28 DIAGNOSIS — L910 Hypertrophic scar: Secondary | ICD-10-CM | POA: Diagnosis not present

## 2019-07-08 DIAGNOSIS — K529 Noninfective gastroenteritis and colitis, unspecified: Secondary | ICD-10-CM | POA: Diagnosis not present

## 2020-01-02 DIAGNOSIS — Z13220 Encounter for screening for lipoid disorders: Secondary | ICD-10-CM | POA: Diagnosis not present

## 2020-01-02 DIAGNOSIS — R7309 Other abnormal glucose: Secondary | ICD-10-CM | POA: Diagnosis not present

## 2020-01-06 DIAGNOSIS — F329 Major depressive disorder, single episode, unspecified: Secondary | ICD-10-CM | POA: Diagnosis not present

## 2020-01-06 DIAGNOSIS — F411 Generalized anxiety disorder: Secondary | ICD-10-CM | POA: Diagnosis not present

## 2020-01-15 DIAGNOSIS — L814 Other melanin hyperpigmentation: Secondary | ICD-10-CM | POA: Diagnosis not present

## 2020-01-15 DIAGNOSIS — L570 Actinic keratosis: Secondary | ICD-10-CM | POA: Diagnosis not present

## 2020-01-15 DIAGNOSIS — Z85828 Personal history of other malignant neoplasm of skin: Secondary | ICD-10-CM | POA: Diagnosis not present

## 2020-01-15 DIAGNOSIS — L821 Other seborrheic keratosis: Secondary | ICD-10-CM | POA: Diagnosis not present

## 2020-01-16 DIAGNOSIS — Z01419 Encounter for gynecological examination (general) (routine) without abnormal findings: Secondary | ICD-10-CM | POA: Diagnosis not present

## 2020-01-16 DIAGNOSIS — Z1231 Encounter for screening mammogram for malignant neoplasm of breast: Secondary | ICD-10-CM | POA: Diagnosis not present

## 2020-01-16 DIAGNOSIS — Z6830 Body mass index (BMI) 30.0-30.9, adult: Secondary | ICD-10-CM | POA: Diagnosis not present

## 2020-07-20 DIAGNOSIS — R002 Palpitations: Secondary | ICD-10-CM | POA: Diagnosis not present

## 2020-07-20 DIAGNOSIS — H6120 Impacted cerumen, unspecified ear: Secondary | ICD-10-CM | POA: Diagnosis not present

## 2020-07-20 DIAGNOSIS — M542 Cervicalgia: Secondary | ICD-10-CM | POA: Diagnosis not present

## 2020-07-20 DIAGNOSIS — R0683 Snoring: Secondary | ICD-10-CM | POA: Diagnosis not present

## 2020-08-13 DIAGNOSIS — R198 Other specified symptoms and signs involving the digestive system and abdomen: Secondary | ICD-10-CM | POA: Diagnosis not present

## 2020-08-13 DIAGNOSIS — K219 Gastro-esophageal reflux disease without esophagitis: Secondary | ICD-10-CM | POA: Diagnosis not present

## 2020-08-25 DIAGNOSIS — G4719 Other hypersomnia: Secondary | ICD-10-CM | POA: Diagnosis not present

## 2020-09-14 DIAGNOSIS — K2930 Chronic superficial gastritis without bleeding: Secondary | ICD-10-CM | POA: Diagnosis not present

## 2020-09-14 DIAGNOSIS — R12 Heartburn: Secondary | ICD-10-CM | POA: Diagnosis not present

## 2020-09-14 DIAGNOSIS — K449 Diaphragmatic hernia without obstruction or gangrene: Secondary | ICD-10-CM | POA: Diagnosis not present

## 2020-09-14 DIAGNOSIS — K317 Polyp of stomach and duodenum: Secondary | ICD-10-CM | POA: Diagnosis not present

## 2020-09-14 DIAGNOSIS — K222 Esophageal obstruction: Secondary | ICD-10-CM | POA: Diagnosis not present

## 2020-10-06 DIAGNOSIS — E7800 Pure hypercholesterolemia, unspecified: Secondary | ICD-10-CM | POA: Diagnosis not present

## 2020-10-06 DIAGNOSIS — R7303 Prediabetes: Secondary | ICD-10-CM | POA: Diagnosis not present

## 2020-10-06 DIAGNOSIS — R29818 Other symptoms and signs involving the nervous system: Secondary | ICD-10-CM | POA: Diagnosis not present

## 2020-10-06 DIAGNOSIS — F411 Generalized anxiety disorder: Secondary | ICD-10-CM | POA: Diagnosis not present

## 2020-10-12 DIAGNOSIS — G4733 Obstructive sleep apnea (adult) (pediatric): Secondary | ICD-10-CM | POA: Diagnosis not present

## 2020-12-28 DIAGNOSIS — K648 Other hemorrhoids: Secondary | ICD-10-CM | POA: Diagnosis not present

## 2020-12-28 DIAGNOSIS — D127 Benign neoplasm of rectosigmoid junction: Secondary | ICD-10-CM | POA: Diagnosis not present

## 2020-12-28 DIAGNOSIS — Z1211 Encounter for screening for malignant neoplasm of colon: Secondary | ICD-10-CM | POA: Diagnosis not present

## 2021-01-11 DIAGNOSIS — L821 Other seborrheic keratosis: Secondary | ICD-10-CM | POA: Diagnosis not present

## 2021-01-11 DIAGNOSIS — D225 Melanocytic nevi of trunk: Secondary | ICD-10-CM | POA: Diagnosis not present

## 2021-01-11 DIAGNOSIS — D2262 Melanocytic nevi of left upper limb, including shoulder: Secondary | ICD-10-CM | POA: Diagnosis not present

## 2021-01-11 DIAGNOSIS — Z85828 Personal history of other malignant neoplasm of skin: Secondary | ICD-10-CM | POA: Diagnosis not present

## 2021-01-15 DIAGNOSIS — R7303 Prediabetes: Secondary | ICD-10-CM | POA: Diagnosis not present

## 2021-01-15 DIAGNOSIS — E7800 Pure hypercholesterolemia, unspecified: Secondary | ICD-10-CM | POA: Diagnosis not present

## 2021-02-08 DIAGNOSIS — Z1231 Encounter for screening mammogram for malignant neoplasm of breast: Secondary | ICD-10-CM | POA: Diagnosis not present

## 2021-02-08 DIAGNOSIS — Z6832 Body mass index (BMI) 32.0-32.9, adult: Secondary | ICD-10-CM | POA: Diagnosis not present

## 2021-02-08 DIAGNOSIS — Z01419 Encounter for gynecological examination (general) (routine) without abnormal findings: Secondary | ICD-10-CM | POA: Diagnosis not present

## 2021-02-15 ENCOUNTER — Other Ambulatory Visit: Payer: Self-pay | Admitting: Obstetrics and Gynecology

## 2021-02-15 DIAGNOSIS — Z803 Family history of malignant neoplasm of breast: Secondary | ICD-10-CM

## 2021-02-25 IMAGING — US ULTRASOUND ABDOMEN LIMITED
1 series · 14 of 25 positions shown · non-contrast
Comparison: 08/15/2014

CLINICAL DATA: Back pain radiating to the abdomen.  Nausea.

EXAM:
ULTRASOUND ABDOMEN LIMITED RIGHT UPPER QUADRANT

[Series 1: ultrasound abdomen limited · 14 of 40 slices shown]
[im 1/40]
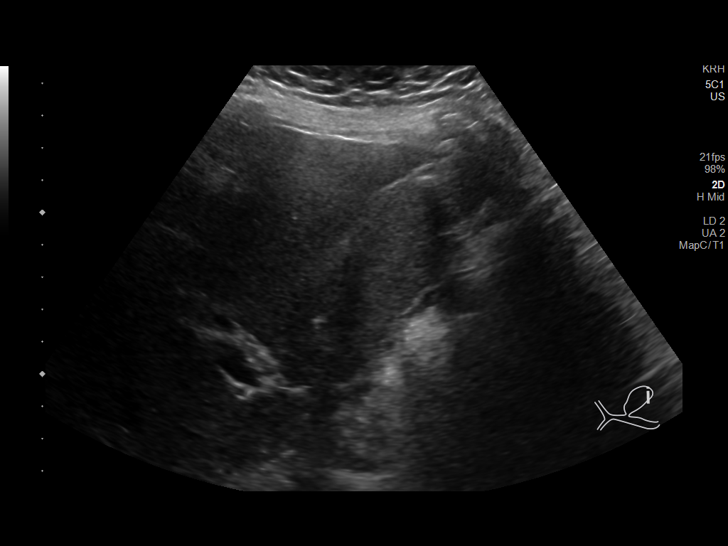
[im 4/40]
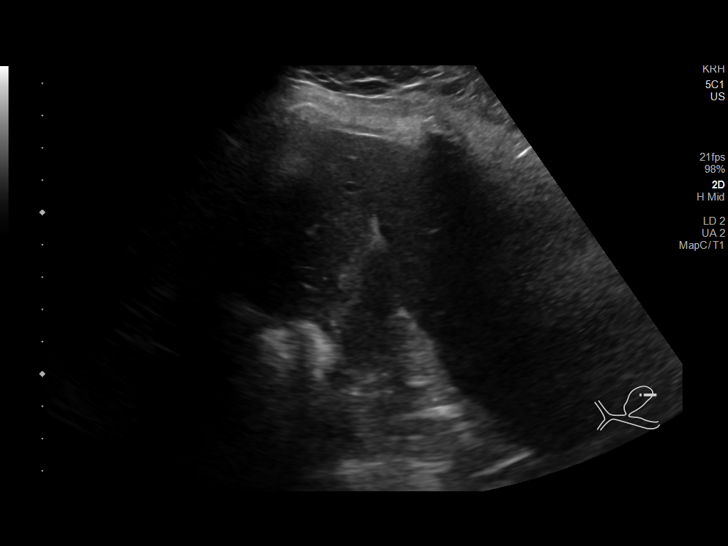
[im 7/40]
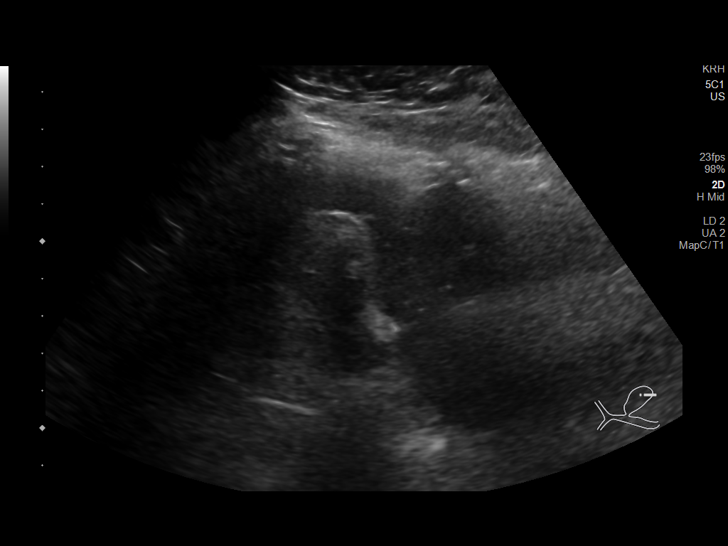
[im 10/40]
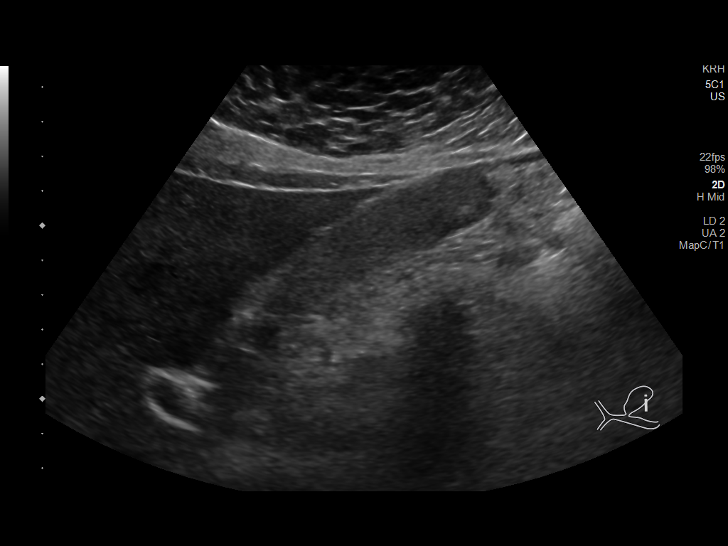
[im 14/40]
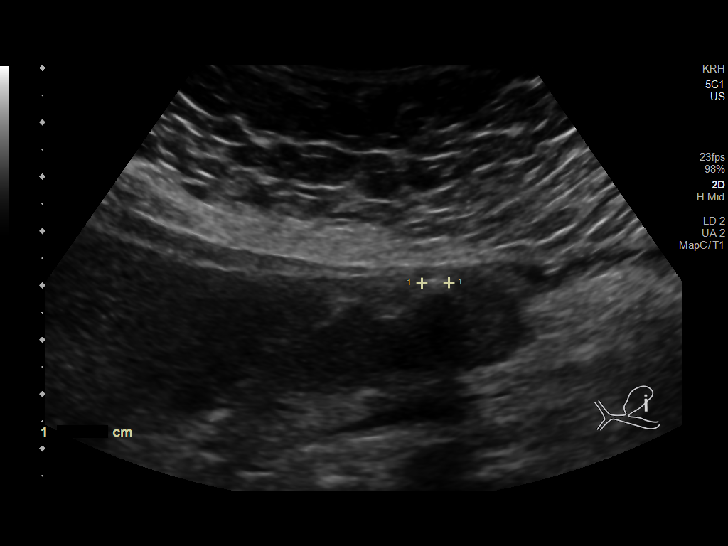
[im 15/40]
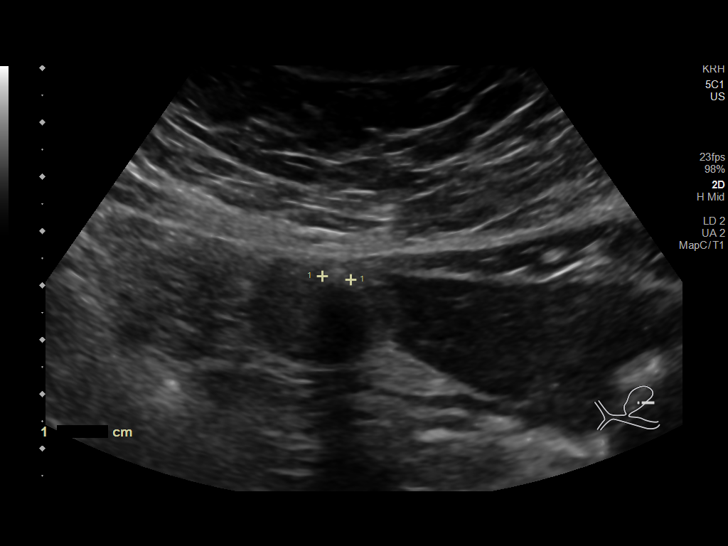
[im 18/40]
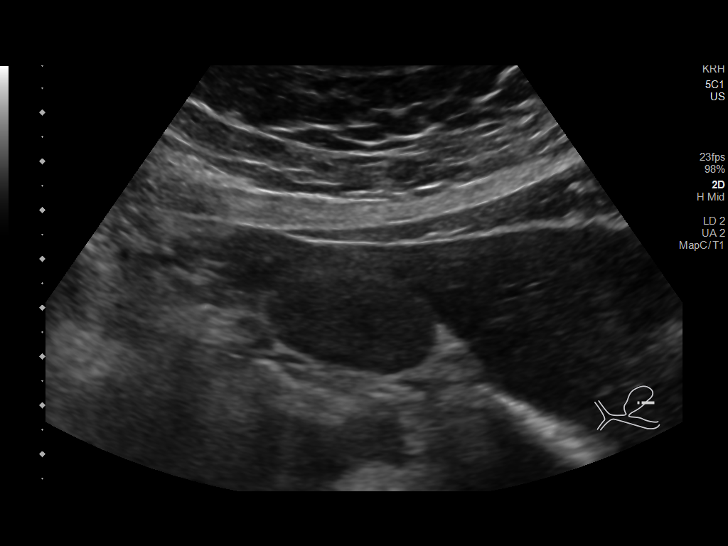
[im 22/40]
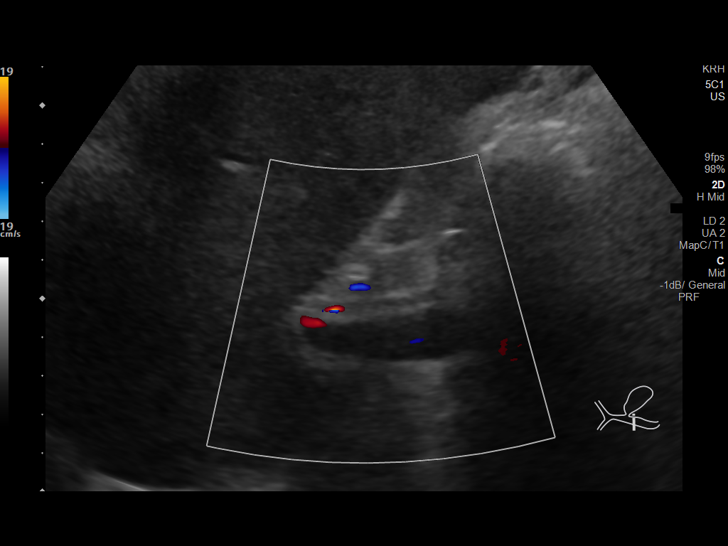
[im 25/40]
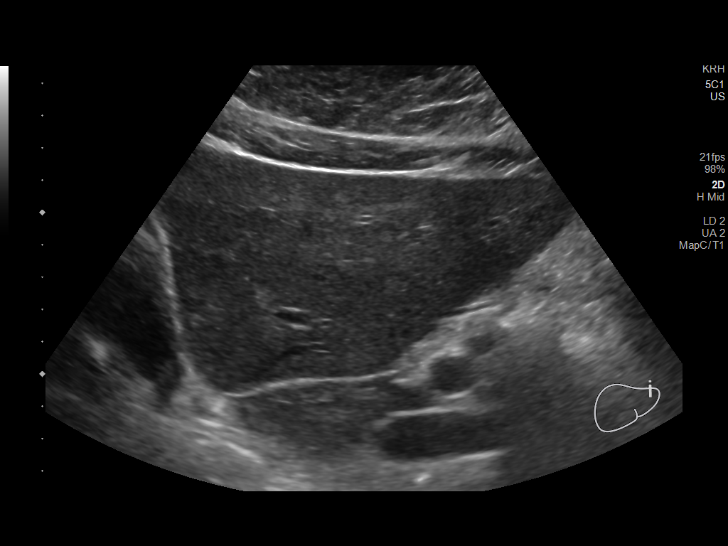
[im 27/40]
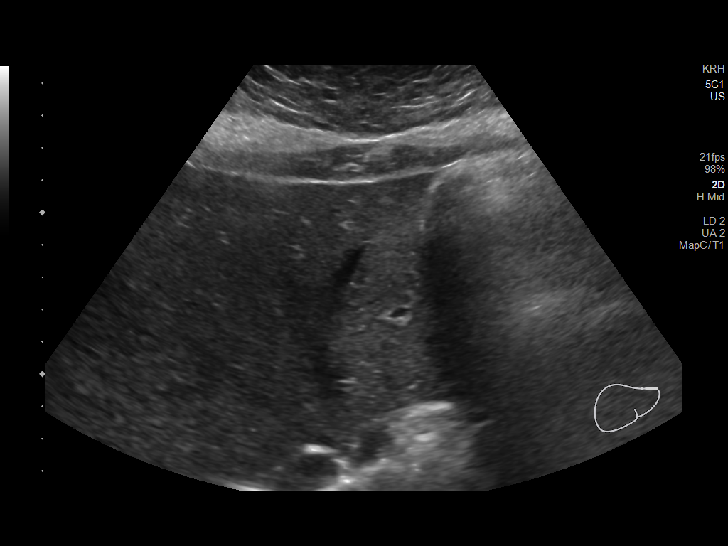
[im 30/40]
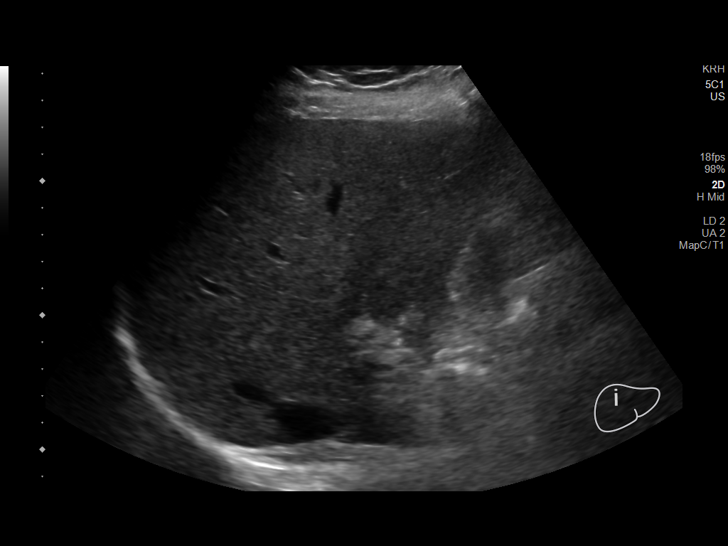
[im 33/40]
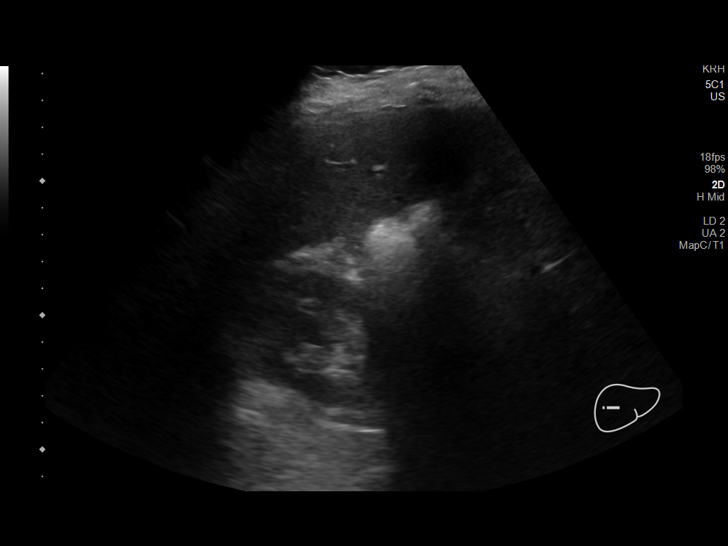
[im 36/40]
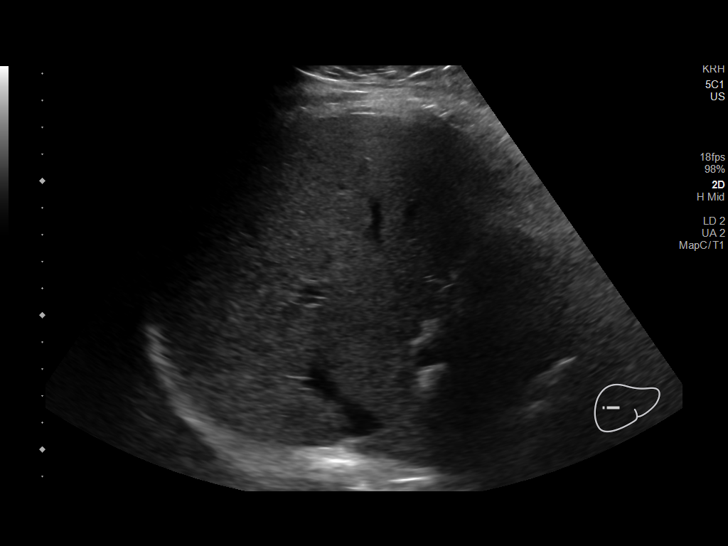
[im 40/40]
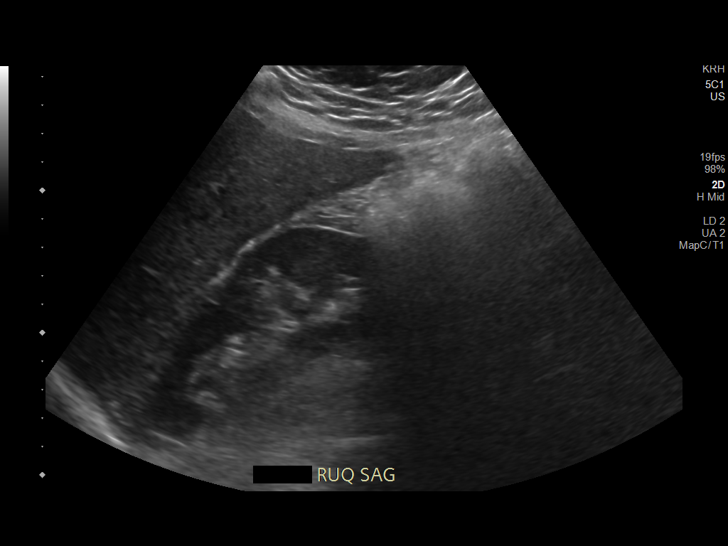

[14 of 25 positions shown; findings below may reference images not displayed]

FINDINGS: Gallbladder:

The gallbladder is filled with sludge. Non dependent gallstones are
noted. The largest measures 5 mm. No Murphy sign or wall thickening.
No pericholecystic fluid.

Common bile duct:

Diameter: 3 mm.

Liver:

No focal lesion identified. Within normal limits in parenchymal
echogenicity. Portal vein is patent on color Doppler imaging with
normal direction of blood flow towards the liver.
IMPRESSION: Cholelithiasis

Normal gallbladder and biliary tree.

## 2021-05-04 DIAGNOSIS — Z713 Dietary counseling and surveillance: Secondary | ICD-10-CM | POA: Diagnosis not present

## 2021-05-04 DIAGNOSIS — E669 Obesity, unspecified: Secondary | ICD-10-CM | POA: Diagnosis not present

## 2021-05-12 DIAGNOSIS — R7303 Prediabetes: Secondary | ICD-10-CM | POA: Diagnosis not present

## 2021-05-12 DIAGNOSIS — G4733 Obstructive sleep apnea (adult) (pediatric): Secondary | ICD-10-CM | POA: Diagnosis not present

## 2021-05-12 DIAGNOSIS — F411 Generalized anxiety disorder: Secondary | ICD-10-CM | POA: Diagnosis not present

## 2021-05-12 DIAGNOSIS — F329 Major depressive disorder, single episode, unspecified: Secondary | ICD-10-CM | POA: Diagnosis not present

## 2021-11-24 DIAGNOSIS — F329 Major depressive disorder, single episode, unspecified: Secondary | ICD-10-CM | POA: Diagnosis not present

## 2021-11-24 DIAGNOSIS — R7303 Prediabetes: Secondary | ICD-10-CM | POA: Diagnosis not present

## 2021-11-24 DIAGNOSIS — G4733 Obstructive sleep apnea (adult) (pediatric): Secondary | ICD-10-CM | POA: Diagnosis not present

## 2021-11-24 DIAGNOSIS — F411 Generalized anxiety disorder: Secondary | ICD-10-CM | POA: Diagnosis not present

## 2022-01-12 DIAGNOSIS — R058 Other specified cough: Secondary | ICD-10-CM | POA: Diagnosis not present

## 2022-01-13 DIAGNOSIS — D2262 Melanocytic nevi of left upper limb, including shoulder: Secondary | ICD-10-CM | POA: Diagnosis not present

## 2022-01-13 DIAGNOSIS — Z85828 Personal history of other malignant neoplasm of skin: Secondary | ICD-10-CM | POA: Diagnosis not present

## 2022-01-13 DIAGNOSIS — C44519 Basal cell carcinoma of skin of other part of trunk: Secondary | ICD-10-CM | POA: Diagnosis not present

## 2022-01-13 DIAGNOSIS — L570 Actinic keratosis: Secondary | ICD-10-CM | POA: Diagnosis not present

## 2022-01-13 DIAGNOSIS — L821 Other seborrheic keratosis: Secondary | ICD-10-CM | POA: Diagnosis not present

## 2022-01-13 DIAGNOSIS — D485 Neoplasm of uncertain behavior of skin: Secondary | ICD-10-CM | POA: Diagnosis not present

## 2022-01-13 DIAGNOSIS — D225 Melanocytic nevi of trunk: Secondary | ICD-10-CM | POA: Diagnosis not present

## 2022-01-19 DIAGNOSIS — E7800 Pure hypercholesterolemia, unspecified: Secondary | ICD-10-CM | POA: Diagnosis not present

## 2022-01-19 DIAGNOSIS — R7303 Prediabetes: Secondary | ICD-10-CM | POA: Diagnosis not present

## 2022-02-15 DIAGNOSIS — Z01419 Encounter for gynecological examination (general) (routine) without abnormal findings: Secondary | ICD-10-CM | POA: Diagnosis not present

## 2022-02-15 DIAGNOSIS — Z6826 Body mass index (BMI) 26.0-26.9, adult: Secondary | ICD-10-CM | POA: Diagnosis not present

## 2022-02-15 DIAGNOSIS — Z1231 Encounter for screening mammogram for malignant neoplasm of breast: Secondary | ICD-10-CM | POA: Diagnosis not present

## 2022-02-17 ENCOUNTER — Other Ambulatory Visit: Payer: Self-pay | Admitting: Obstetrics and Gynecology

## 2022-02-17 DIAGNOSIS — Z803 Family history of malignant neoplasm of breast: Secondary | ICD-10-CM

## 2022-03-03 DIAGNOSIS — L988 Other specified disorders of the skin and subcutaneous tissue: Secondary | ICD-10-CM | POA: Diagnosis not present

## 2022-03-03 DIAGNOSIS — C44519 Basal cell carcinoma of skin of other part of trunk: Secondary | ICD-10-CM | POA: Diagnosis not present

## 2022-03-08 DIAGNOSIS — M79645 Pain in left finger(s): Secondary | ICD-10-CM | POA: Diagnosis not present

## 2022-03-08 DIAGNOSIS — M542 Cervicalgia: Secondary | ICD-10-CM | POA: Diagnosis not present

## 2022-03-22 ENCOUNTER — Ambulatory Visit
Admission: RE | Admit: 2022-03-22 | Discharge: 2022-03-22 | Disposition: A | Discharge status: Home / Self Care | Payer: BC Managed Care – PPO | Source: Ambulatory Visit | Attending: Family Medicine | Admitting: Family Medicine

## 2022-03-22 ENCOUNTER — Other Ambulatory Visit: Payer: Self-pay | Admitting: Family Medicine

## 2022-03-22 ENCOUNTER — Ambulatory Visit
Admission: RE | Admit: 2022-03-22 | Discharge: 2022-03-22 | Disposition: A | Discharge status: Home / Self Care | Payer: BC Managed Care – PPO | Source: Ambulatory Visit | Attending: Obstetrics and Gynecology | Admitting: Obstetrics and Gynecology

## 2022-03-22 DIAGNOSIS — M542 Cervicalgia: Secondary | ICD-10-CM

## 2022-03-22 DIAGNOSIS — M47812 Spondylosis without myelopathy or radiculopathy, cervical region: Secondary | ICD-10-CM | POA: Diagnosis not present

## 2022-03-22 DIAGNOSIS — M4802 Spinal stenosis, cervical region: Secondary | ICD-10-CM | POA: Diagnosis not present

## 2022-03-22 DIAGNOSIS — Z803 Family history of malignant neoplasm of breast: Secondary | ICD-10-CM

## 2022-03-22 DIAGNOSIS — N6489 Other specified disorders of breast: Secondary | ICD-10-CM | POA: Diagnosis not present

## 2022-03-22 MED ORDER — GADOPICLENOL 0.5 MMOL/ML IV SOLN
9.0000 mL | Freq: Once | INTRAVENOUS | Status: AC | PRN
  Administered 2022-03-22: 9 mL via INTRAVENOUS

## 2022-03-29 DIAGNOSIS — R7303 Prediabetes: Secondary | ICD-10-CM | POA: Diagnosis not present

## 2022-04-05 DIAGNOSIS — M6281 Muscle weakness (generalized): Secondary | ICD-10-CM | POA: Diagnosis not present

## 2022-04-05 DIAGNOSIS — R293 Abnormal posture: Secondary | ICD-10-CM | POA: Diagnosis not present

## 2022-04-05 DIAGNOSIS — M542 Cervicalgia: Secondary | ICD-10-CM | POA: Diagnosis not present

## 2022-04-07 DIAGNOSIS — M6281 Muscle weakness (generalized): Secondary | ICD-10-CM | POA: Diagnosis not present

## 2022-04-07 DIAGNOSIS — M542 Cervicalgia: Secondary | ICD-10-CM | POA: Diagnosis not present

## 2022-04-07 DIAGNOSIS — R293 Abnormal posture: Secondary | ICD-10-CM | POA: Diagnosis not present

## 2022-04-12 DIAGNOSIS — M6281 Muscle weakness (generalized): Secondary | ICD-10-CM | POA: Diagnosis not present

## 2022-04-12 DIAGNOSIS — M542 Cervicalgia: Secondary | ICD-10-CM | POA: Diagnosis not present

## 2022-04-12 DIAGNOSIS — R293 Abnormal posture: Secondary | ICD-10-CM | POA: Diagnosis not present

## 2022-04-19 DIAGNOSIS — R293 Abnormal posture: Secondary | ICD-10-CM | POA: Diagnosis not present

## 2022-04-19 DIAGNOSIS — M6281 Muscle weakness (generalized): Secondary | ICD-10-CM | POA: Diagnosis not present

## 2022-04-19 DIAGNOSIS — M542 Cervicalgia: Secondary | ICD-10-CM | POA: Diagnosis not present

## 2022-04-21 DIAGNOSIS — R293 Abnormal posture: Secondary | ICD-10-CM | POA: Diagnosis not present

## 2022-04-21 DIAGNOSIS — M6281 Muscle weakness (generalized): Secondary | ICD-10-CM | POA: Diagnosis not present

## 2022-04-21 DIAGNOSIS — M542 Cervicalgia: Secondary | ICD-10-CM | POA: Diagnosis not present

## 2022-04-26 DIAGNOSIS — M6281 Muscle weakness (generalized): Secondary | ICD-10-CM | POA: Diagnosis not present

## 2022-04-26 DIAGNOSIS — M542 Cervicalgia: Secondary | ICD-10-CM | POA: Diagnosis not present

## 2022-04-26 DIAGNOSIS — R293 Abnormal posture: Secondary | ICD-10-CM | POA: Diagnosis not present

## 2022-04-28 DIAGNOSIS — R293 Abnormal posture: Secondary | ICD-10-CM | POA: Diagnosis not present

## 2022-04-28 DIAGNOSIS — M542 Cervicalgia: Secondary | ICD-10-CM | POA: Diagnosis not present

## 2022-04-28 DIAGNOSIS — M6281 Muscle weakness (generalized): Secondary | ICD-10-CM | POA: Diagnosis not present

## 2022-05-03 DIAGNOSIS — M542 Cervicalgia: Secondary | ICD-10-CM | POA: Diagnosis not present

## 2022-05-03 DIAGNOSIS — R293 Abnormal posture: Secondary | ICD-10-CM | POA: Diagnosis not present

## 2022-05-03 DIAGNOSIS — M6281 Muscle weakness (generalized): Secondary | ICD-10-CM | POA: Diagnosis not present

## 2022-05-05 DIAGNOSIS — R293 Abnormal posture: Secondary | ICD-10-CM | POA: Diagnosis not present

## 2022-05-05 DIAGNOSIS — M542 Cervicalgia: Secondary | ICD-10-CM | POA: Diagnosis not present

## 2022-05-05 DIAGNOSIS — M6281 Muscle weakness (generalized): Secondary | ICD-10-CM | POA: Diagnosis not present

## 2022-05-17 DIAGNOSIS — M542 Cervicalgia: Secondary | ICD-10-CM | POA: Diagnosis not present

## 2022-05-17 DIAGNOSIS — M6281 Muscle weakness (generalized): Secondary | ICD-10-CM | POA: Diagnosis not present

## 2022-05-17 DIAGNOSIS — R293 Abnormal posture: Secondary | ICD-10-CM | POA: Diagnosis not present

## 2022-08-17 DIAGNOSIS — Z23 Encounter for immunization: Secondary | ICD-10-CM | POA: Diagnosis not present

## 2022-10-10 DIAGNOSIS — L2089 Other atopic dermatitis: Secondary | ICD-10-CM | POA: Diagnosis not present

## 2022-10-10 DIAGNOSIS — Z85828 Personal history of other malignant neoplasm of skin: Secondary | ICD-10-CM | POA: Diagnosis not present

## 2022-11-09 DIAGNOSIS — G4733 Obstructive sleep apnea (adult) (pediatric): Secondary | ICD-10-CM | POA: Diagnosis not present

## 2022-11-09 DIAGNOSIS — F411 Generalized anxiety disorder: Secondary | ICD-10-CM | POA: Diagnosis not present

## 2022-11-09 DIAGNOSIS — R7303 Prediabetes: Secondary | ICD-10-CM | POA: Diagnosis not present

## 2022-11-09 DIAGNOSIS — F329 Major depressive disorder, single episode, unspecified: Secondary | ICD-10-CM | POA: Diagnosis not present

## 2022-11-09 DIAGNOSIS — Z23 Encounter for immunization: Secondary | ICD-10-CM | POA: Diagnosis not present

## 2023-02-13 DIAGNOSIS — R350 Frequency of micturition: Secondary | ICD-10-CM | POA: Diagnosis not present

## 2023-02-23 DIAGNOSIS — Z1231 Encounter for screening mammogram for malignant neoplasm of breast: Secondary | ICD-10-CM | POA: Diagnosis not present

## 2023-02-23 DIAGNOSIS — Z01419 Encounter for gynecological examination (general) (routine) without abnormal findings: Secondary | ICD-10-CM | POA: Diagnosis not present

## 2023-02-23 DIAGNOSIS — Z6828 Body mass index (BMI) 28.0-28.9, adult: Secondary | ICD-10-CM | POA: Diagnosis not present

## 2023-02-24 DIAGNOSIS — R7303 Prediabetes: Secondary | ICD-10-CM | POA: Diagnosis not present

## 2023-02-24 DIAGNOSIS — E7800 Pure hypercholesterolemia, unspecified: Secondary | ICD-10-CM | POA: Diagnosis not present

## 2023-02-27 ENCOUNTER — Other Ambulatory Visit: Payer: Self-pay | Admitting: Obstetrics and Gynecology

## 2023-02-27 DIAGNOSIS — R928 Other abnormal and inconclusive findings on diagnostic imaging of breast: Secondary | ICD-10-CM

## 2023-03-16 ENCOUNTER — Other Ambulatory Visit: Payer: BC Managed Care – PPO

## 2023-03-24 DIAGNOSIS — Z309 Encounter for contraceptive management, unspecified: Secondary | ICD-10-CM | POA: Diagnosis not present

## 2023-05-24 DIAGNOSIS — E8941 Symptomatic postprocedural ovarian failure: Secondary | ICD-10-CM | POA: Diagnosis not present

## 2023-11-08 DIAGNOSIS — R7303 Prediabetes: Secondary | ICD-10-CM | POA: Diagnosis not present

## 2023-11-08 DIAGNOSIS — M259 Joint disorder, unspecified: Secondary | ICD-10-CM | POA: Diagnosis not present

## 2023-11-08 DIAGNOSIS — F411 Generalized anxiety disorder: Secondary | ICD-10-CM | POA: Diagnosis not present

## 2023-11-08 DIAGNOSIS — E7800 Pure hypercholesterolemia, unspecified: Secondary | ICD-10-CM | POA: Diagnosis not present

## 2023-11-08 DIAGNOSIS — G4733 Obstructive sleep apnea (adult) (pediatric): Secondary | ICD-10-CM | POA: Diagnosis not present

## 2023-11-08 DIAGNOSIS — K219 Gastro-esophageal reflux disease without esophagitis: Secondary | ICD-10-CM | POA: Diagnosis not present

## 2024-02-27 DIAGNOSIS — Z01419 Encounter for gynecological examination (general) (routine) without abnormal findings: Secondary | ICD-10-CM | POA: Diagnosis not present

## 2024-02-27 DIAGNOSIS — R87610 Atypical squamous cells of undetermined significance on cytologic smear of cervix (ASC-US): Secondary | ICD-10-CM | POA: Diagnosis not present

## 2024-02-27 DIAGNOSIS — Z6832 Body mass index (BMI) 32.0-32.9, adult: Secondary | ICD-10-CM | POA: Diagnosis not present

## 2024-02-27 DIAGNOSIS — R87810 Cervical high risk human papillomavirus (HPV) DNA test positive: Secondary | ICD-10-CM | POA: Diagnosis not present
